# Patient Record
Sex: Female | Born: 1995 | Race: White | Hispanic: No | Marital: Single | Smoking: Never smoker
Health system: Southern US, Community
[De-identification: ages and names within clinical notes are randomized; demographics above are authoritative.]

---

## 2015-11-12 ENCOUNTER — Encounter: Payer: Self-pay | Admitting: *Deleted

## 2015-11-12 DIAGNOSIS — R1011 Right upper quadrant pain: Secondary | ICD-10-CM | POA: Insufficient documentation

## 2015-11-12 DIAGNOSIS — R109 Unspecified abdominal pain: Secondary | ICD-10-CM | POA: Diagnosis present

## 2015-11-12 LAB — COMPREHENSIVE METABOLIC PANEL
ALBUMIN: 4.5 g/dL (ref 3.5–5.0)
ALT: 22 U/L (ref 14–54)
AST: 21 U/L (ref 15–41)
Alkaline Phosphatase: 50 U/L (ref 38–126)
Anion gap: 5 (ref 5–15)
BUN: 15 mg/dL (ref 6–20)
CHLORIDE: 106 mmol/L (ref 101–111)
CO2: 26 mmol/L (ref 22–32)
CREATININE: 0.84 mg/dL (ref 0.44–1.00)
Calcium: 9.3 mg/dL (ref 8.9–10.3)
GFR calc Af Amer: >60 mL/min (ref >60)
GLUCOSE: 123 mg/dL — AB (ref 65–99)
POTASSIUM: 3.6 mmol/L (ref 3.5–5.1)
SODIUM: 137 mmol/L (ref 135–145)
Total Bilirubin: 0.4 mg/dL (ref 0.3–1.2)
Total Protein: 8.1 g/dL (ref 6.5–8.1)

## 2015-11-12 LAB — URINALYSIS COMPLETE WITH MICROSCOPIC (ARMC ONLY)
Bilirubin Urine: NEGATIVE
GLUCOSE, UA: NEGATIVE mg/dL
KETONES UR: NEGATIVE mg/dL
LEUKOCYTES UA: NEGATIVE
Nitrite: NEGATIVE
PROTEIN: NEGATIVE mg/dL
SPECIFIC GRAVITY, URINE: 1.023 (ref 1.005–1.030)
pH: 5 (ref 5.0–8.0)

## 2015-11-12 LAB — CBC
HEMATOCRIT: 40.7 % (ref 35.0–47.0)
Hemoglobin: 13.8 g/dL (ref 12.0–16.0)
MCH: 26.4 pg (ref 26.0–34.0)
MCHC: 33.8 g/dL (ref 32.0–36.0)
MCV: 78 fL — AB (ref 80.0–100.0)
PLATELETS: 320 10*3/uL (ref 150–440)
RBC: 5.23 MIL/uL — ABNORMAL HIGH (ref 3.80–5.20)
RDW: 13.5 % (ref 11.5–14.5)
WBC: 11 10*3/uL (ref 3.6–11.0)

## 2015-11-12 LAB — POCT PREGNANCY, URINE: Preg Test, Ur: NEGATIVE

## 2015-11-12 LAB — LIPASE, BLOOD: LIPASE: 44 U/L (ref 11–51)

## 2015-11-12 MED ORDER — ONDANSETRON HCL 4 MG/2ML IJ SOLN
4.0000 mg | Freq: Once | INTRAMUSCULAR | Status: AC | PRN
  Administered 2015-11-12: 4 mg via INTRAVENOUS
  Filled 2015-11-12: qty 2

## 2015-11-12 MED ORDER — SODIUM CHLORIDE 0.9 % IV SOLN
Freq: Once | INTRAVENOUS | Status: AC
  Administered 2015-11-12: via INTRAVENOUS

## 2015-11-12 NOTE — ED Notes (Signed)
Pt c/o progressively worsening RLQ abdominal pain, n/v/d. Pt c/o chills and rigors w/ abdominal pain. Pt states this has been recurrent over past two weeks, persistent throughout today and worsening at 2000 tonight. Pt is tachycardiac and flushed at this time.

## 2015-11-13 ENCOUNTER — Emergency Department: Payer: BLUE CROSS/BLUE SHIELD

## 2015-11-13 ENCOUNTER — Emergency Department
Admission: EM | Admit: 2015-11-13 | Discharge: 2015-11-13 | Disposition: A | Discharge status: Home / Self Care | Payer: BLUE CROSS/BLUE SHIELD | Attending: Emergency Medicine | Admitting: Emergency Medicine

## 2015-11-13 DIAGNOSIS — R109 Unspecified abdominal pain: Secondary | ICD-10-CM

## 2015-11-13 MED ORDER — SUCRALFATE 1 G PO TABS: 1.0000 g | ORAL_TABLET | Freq: Two times a day (BID) | ORAL | Status: AC

## 2015-11-13 MED ORDER — MORPHINE SULFATE (PF) 4 MG/ML IV SOLN
4.0000 mg | Freq: Once | INTRAVENOUS | Status: AC
  Administered 2015-11-13: 4 mg via INTRAVENOUS
  Filled 2015-11-13: qty 1

## 2015-11-13 MED ORDER — GI COCKTAIL ~~LOC~~
30.0000 mL | Freq: Once | ORAL | Status: AC
  Administered 2015-11-13: 30 mL via ORAL
  Filled 2015-11-13: qty 30

## 2015-11-13 MED ORDER — ONDANSETRON HCL 4 MG/2ML IJ SOLN
4.0000 mg | Freq: Once | INTRAMUSCULAR | Status: AC
  Administered 2015-11-13: 4 mg via INTRAVENOUS
  Filled 2015-11-13: qty 2

## 2015-11-13 MED ORDER — SODIUM CHLORIDE 0.9 % IV BOLUS (SEPSIS)
1000.0000 mL | Freq: Once | INTRAVENOUS | Status: AC
  Administered 2015-11-13: 1000 mL via INTRAVENOUS

## 2015-11-13 MED ORDER — IOPAMIDOL (ISOVUE-300) INJECTION 61%
125.0000 mL | Freq: Once | INTRAVENOUS | Status: AC | PRN
  Administered 2015-11-13: 125 mL via INTRAVENOUS

## 2015-11-13 MED ORDER — DIATRIZOATE MEGLUMINE & SODIUM 66-10 % PO SOLN
15.0000 mL | Freq: Once | ORAL | Status: AC
  Administered 2015-11-13: 15 mL via ORAL

## 2015-11-13 NOTE — ED Provider Notes (Signed)
[facility] Emergency Department Provider Note  ____________________________________________  Time seen: Approximately 103 AM  I have reviewed the triage vital signs and the nursing notes.   HISTORY  Chief Complaint Abdominal Pain    HPI Rhonda Edwards is a 20 y.o. female who comes into the hospital today with some right-sided abdominal pain. The patient reports that the pain started 4 days ago. She reports it is been really bad since about 8:30. She is not taking anything for pain. She did take some omeprazole earlier but it has not gotten any better. The patient reports that her pain is in her right upper quadrant. She has never had this before and she reports is worse after eating. She reports she is also had some vomiting and diarrhea after eating for the past 4 days. She denies any fevers and has had some hot and cold flashes with the shakes. The patient rates her pain 8 out of 10 in intensity currently. She feels some shortness of breath because it's worse when she takes a deep breath. The patient is also had some indigestion. She could not tolerate the pain anymore so she decided to come into the hospital for evaluation.   History reviewed. No pertinent past medical history.  There are no active problems to display for this patient.   History reviewed. No pertinent past surgical history.  Current Outpatient Rx  Name  Route  Sig  Dispense  Refill  . sucralfate (CARAFATE) 1 g tablet   Oral   Take 1 tablet (1 g total) by mouth 2 (two) times daily.   20 tablet   0     Allergies Review of patient's allergies indicates no known allergies.  History reviewed. No pertinent family history.  Social History Social History  Substance Use Topics  . Smoking status: Never Smoker   . Smokeless tobacco: Never Used  . Alcohol Use: No    Review of Systems Constitutional: No fever/chills Eyes: No visual changes. ENT: No sore  throat. Cardiovascular: Denies chest pain. Respiratory: Denies shortness of breath. Gastrointestinal: abdominal pain.  nausea, vomiting.  diarrhea.  No constipation. Genitourinary: Negative for dysuria. Musculoskeletal: Negative for back pain. Skin: Negative for rash. Neurological: Negative for headaches, focal weakness or numbness.  10-point ROS otherwise negative.  ____________________________________________   PHYSICAL EXAM:  VITAL SIGNS: ED Triage Vitals  Enc Vitals Group     BP 11/12/15 2306 137/85 mmHg     Pulse Rate 11/12/15 2306 123     Resp 11/12/15 2306 20     Temp 11/12/15 2306 99.3 F (37.4 C)     Temp Source 11/12/15 2306 Oral     SpO2 11/12/15 2306 97 %     Weight 11/12/15 2306 240 lb (108.863 kg)     Height 11/12/15 2306  (1.676 m)     Head Cir --      Peak Flow --      Pain Score 11/12/15 2307 8     Pain Loc --      Pain Edu? --      Excl. in GC? --     Constitutional: Alert and oriented. Well appearing and in Moderate distress. Eyes: Conjunctivae are normal. PERRL. EOMI. Head: Atraumatic. Nose: No congestion/rhinnorhea. Mouth/Throat: Mucous membranes are moist.  Oropharynx non-erythematous. Cardiovascular: Tachycardia regular rhythm. Grossly normal heart sounds.  Good peripheral circulation. Respiratory: Normal respiratory effort.  No retractions. Lungs CTAB. Gastrointestinal: Soft with right upper quadrant tenderness to palpation. No distention. Positive  bowel sounds Musculoskeletal: No lower extremity tenderness nor edema.   Neurologic:  Normal speech and language.  Skin:  Skin is warm, dry and intact.  Psychiatric: Mood and affect are normal.   ____________________________________________   LABS (all labs ordered are listed, but only abnormal results are displayed)  Labs Reviewed  COMPREHENSIVE METABOLIC PANEL - Abnormal; Notable for the following:    Glucose, Bld 123 (*)    All other components within normal limits  CBC - Abnormal;  Notable for the following:    RBC 5.23 (*)    MCV 78.0 (*)    All other components within normal limits  URINALYSIS COMPLETEWITH MICROSCOPIC ([facility] ONLY) - Abnormal; Notable for the following:    Color, Urine YELLOW (*)    APPearance CLEAR (*)    Hgb urine dipstick 1+ (*)    Bacteria, UA RARE (*)    Squamous Epithelial / LPF 0-5 (*)    All other components within normal limits  LIPASE, BLOOD  POC URINE PREG, ED  POCT PREGNANCY, URINE   ____________________________________________  EKG  None ____________________________________________  RADIOLOGY  Right upper quadrant ultrasound: Normal examination  CT abd and pelvis: No CT evidence for acute intraabdominal or pelvic process, normal appendix ____________________________________________   PROCEDURES  Procedure(s) performed: None  Critical Care performed: No  ____________________________________________   INITIAL IMPRESSION / ASSESSMENT AND PLAN / ED COURSE  Pertinent labs & imaging results that were available during my care of the patient were reviewed by me and considered in my medical decision making (see chart for details).  This is a 20 year old female who comes into the hospital today with some right upper quadrant pain that been going on for the past 4 days. The patient was tachycardic when she arrives I did give her a liter of normal saline, morphine and Zofran. The patient receive an ultrasound which was negative for gallstones. The patient's blood work is also unremarkable. I will give the patient a GI cocktail with a concern for gastritis and reassess her symptoms.  The patient did have a return of her shakes and tachycardia prior to the CT scan but the CT scan is unremarkable. At this point the patient's blood work and CT scan is unremarkable. I feel that the patient needs to follow-up with GI. The patient be discharged home to follow-up with the GI physicians. I explain this to the patient and her mother and  they agree with the plan as stated. ____________________________________________   FINAL CLINICAL IMPRESSION(S) / ED DIAGNOSES  Final diagnoses:  Abdominal pain      Rebecka ApleyAllison P Estes Lehner, MD 11/13/15 [phone]

## 2015-11-13 NOTE — Discharge Instructions (Signed)

## 2015-11-13 NOTE — ED Notes (Signed)
Pt discharged to home.  Family member driving.  Discharge instructions reviewed.  Verbalized understanding.  No questions or concerns at this time.  Teach back verified.  Pt in NAD.  No items left in ED.   

## 2017-06-25 IMAGING — US US ABDOMEN LIMITED
1 series · 14 of 25 positions shown · non-contrast
Comparison: None.

CLINICAL DATA: Right upper quadrant abdominal pain for 4 days.
Nausea, vomiting, diarrhea.

EXAM:
US ABDOMEN LIMITED - RIGHT UPPER QUADRANT

[Series 1: us abdomen limited · 0.23mm/px · 14 of 55 slices shown]
[im 1/55]
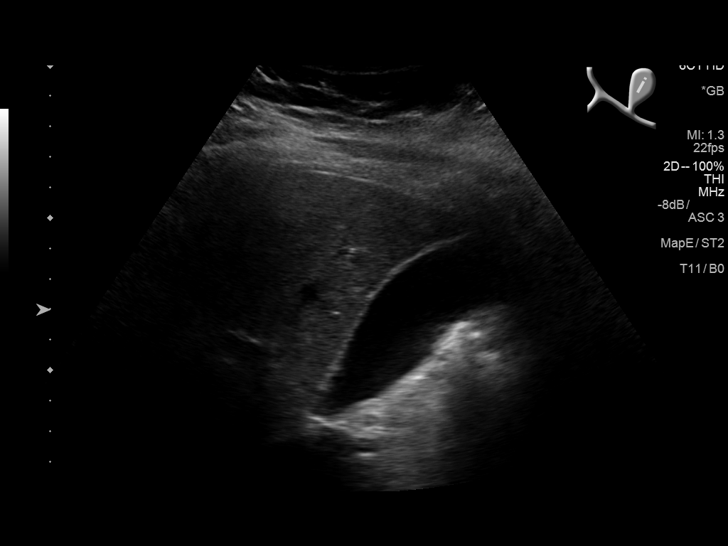
[im 5/55]
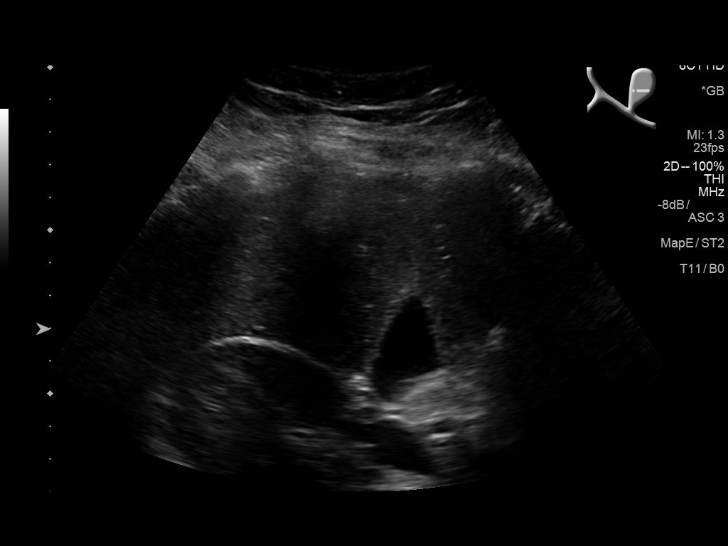
[im 10/55]
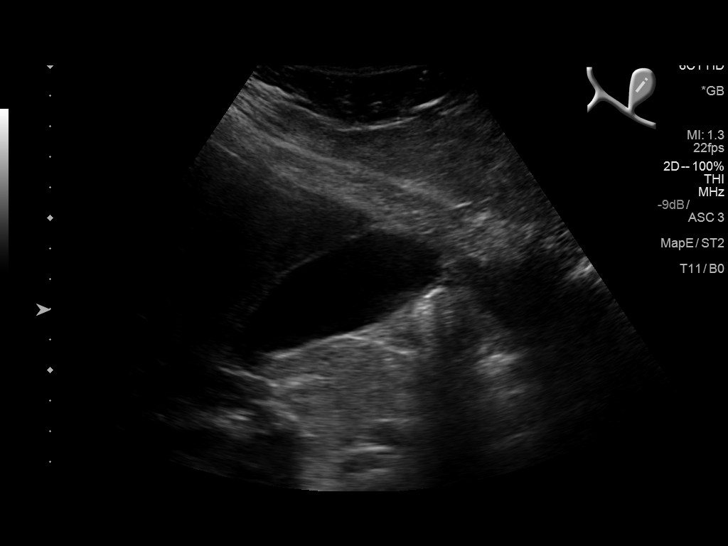
[im 14/55]
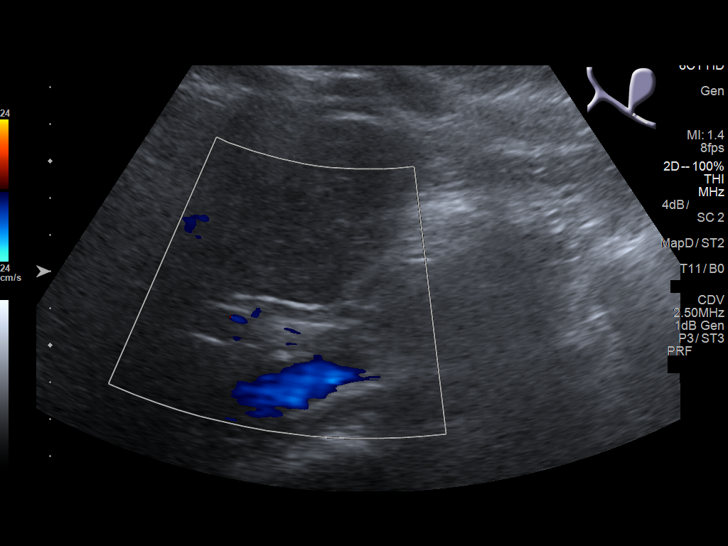
[im 19/55]
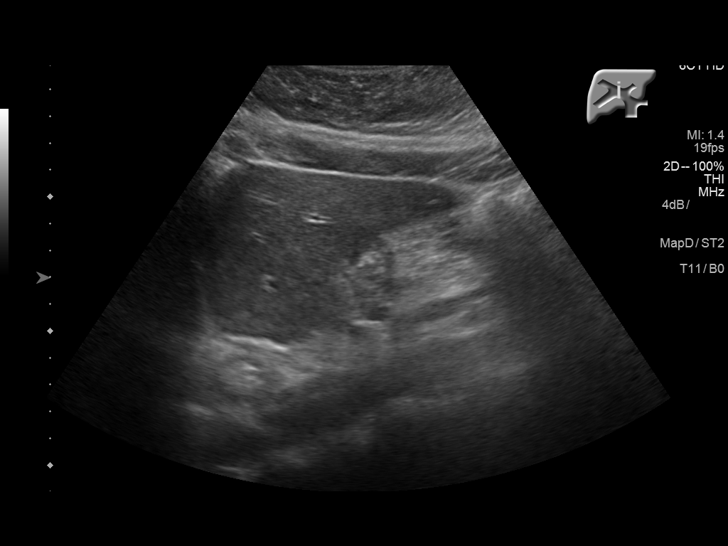
[im 21/55]
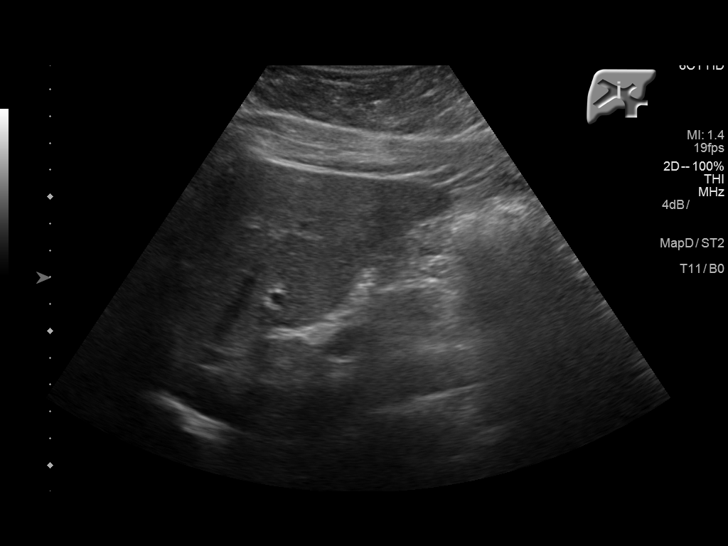
[im 25/55]
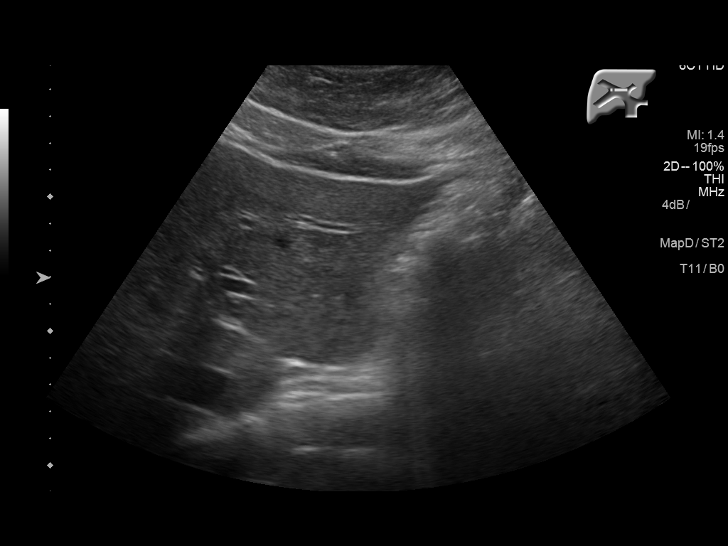
[im 30/55]
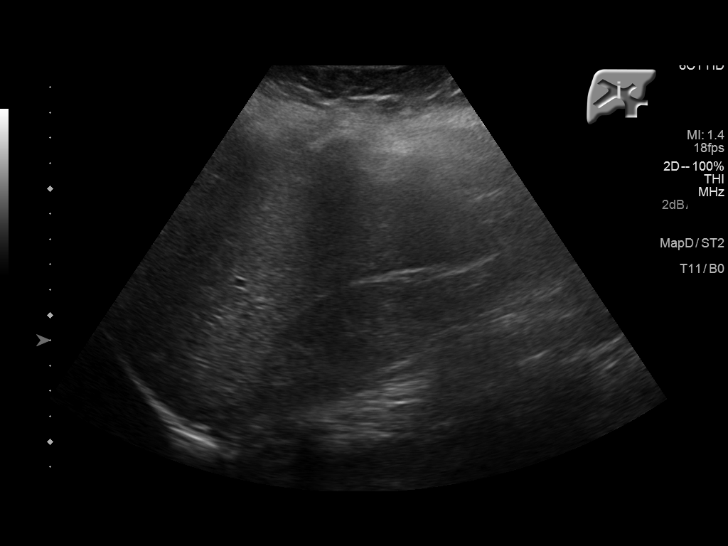
[im 34/55]
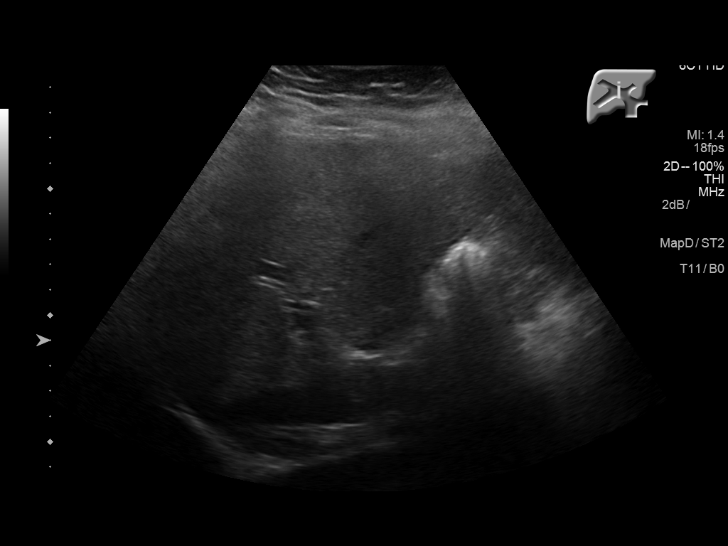
[im 37/55]
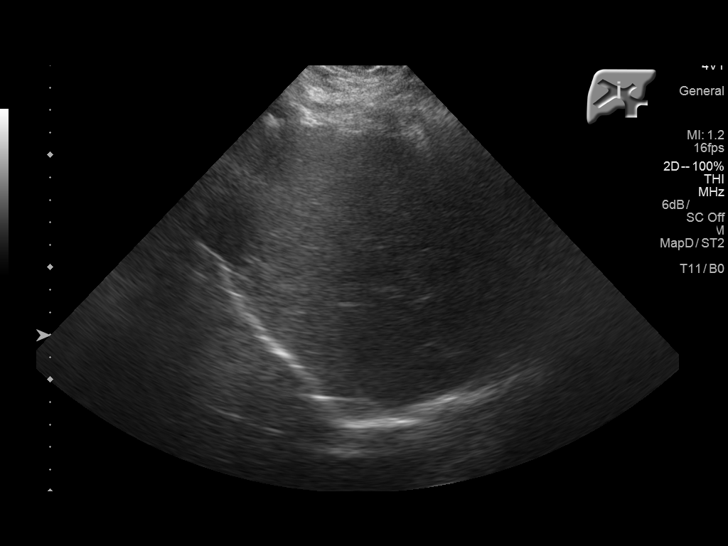
[im 41/55]
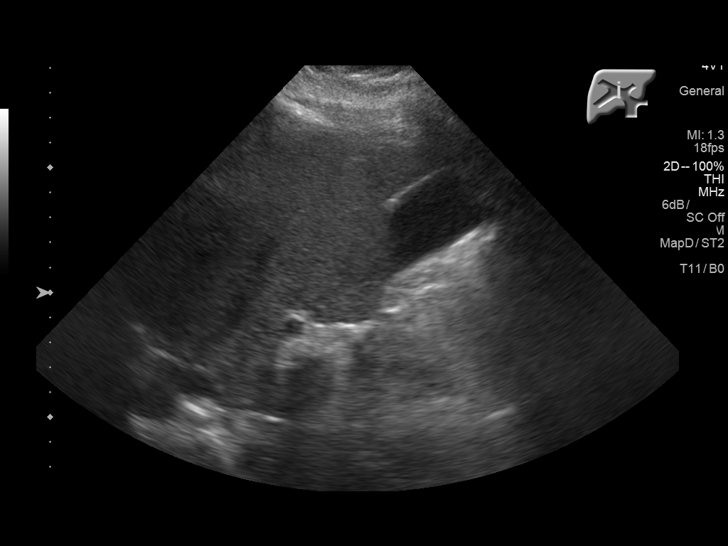
[im 46/55]
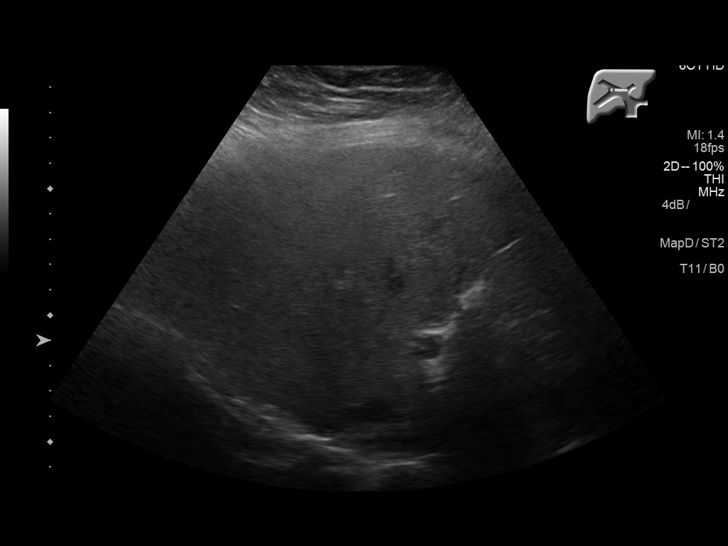
[im 50/55]
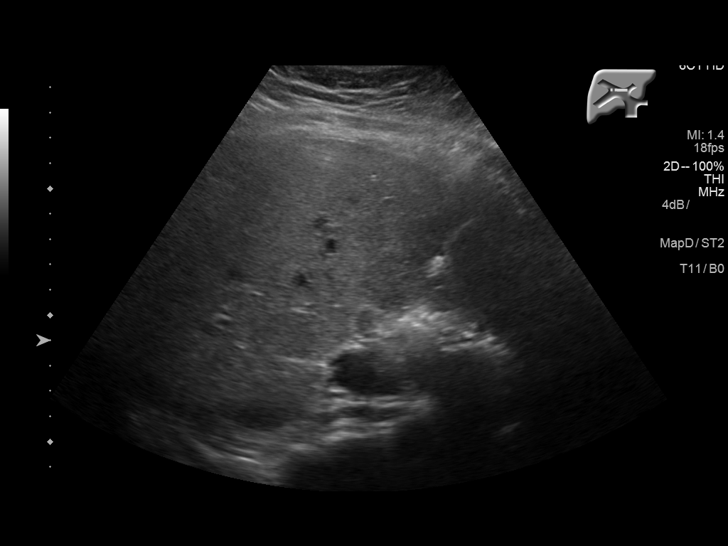
[im 55/55]
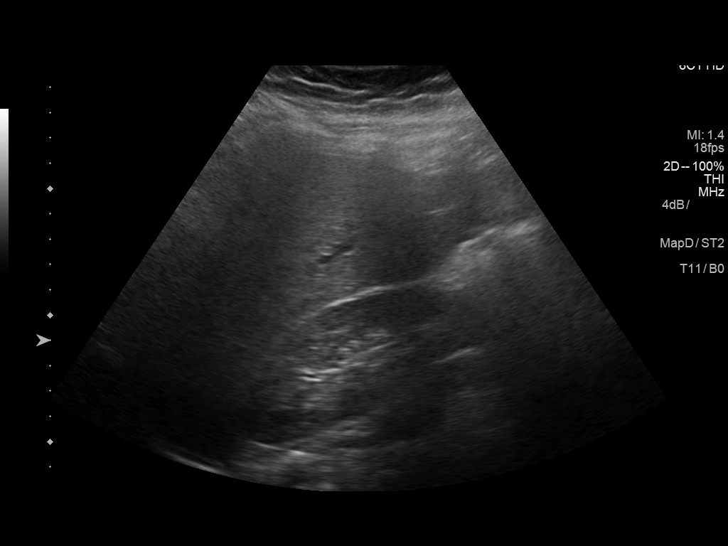

[14 of 25 positions shown; findings below may reference images not displayed]

FINDINGS: Gallbladder:

No gallstones or wall thickening visualized. No sonographic Murphy
sign noted by sonographer.

Common bile duct:

Diameter: 3.5 mm, normal

Liver:

No focal lesion identified. Within normal limits in parenchymal
echogenicity.
IMPRESSION: Normal examination.

## 2017-11-28 IMAGING — CT CT ABD-PELV W/ CM
2 of 4 series · 16 of 46 positions shown, 18 images · IV contrast (iopamidol)
Comparison: Prior ultrasound from earlier the same day.

CLINICAL DATA: Initial evaluation for acute right upper quadrant
pain, nausea, prominent in, diarrhea.

EXAM:
CT ABDOMEN AND PELVIS WITH CONTRAST
TECHNIQUE: Multidetector CT imaging of the abdomen and pelvis was performed
using the standard protocol following bolus administration of
intravenous contrast.
CONTRAST:  125mL [id number] IOPAMIDOL ([id number]) INJECTION 61%

[Series 2: routine abd pel with · axial · 0.89mm/px · z∈[-540,-85]mm · 13 of 101 slices shown, 15 images]
[im 5/101  soft-tissue]
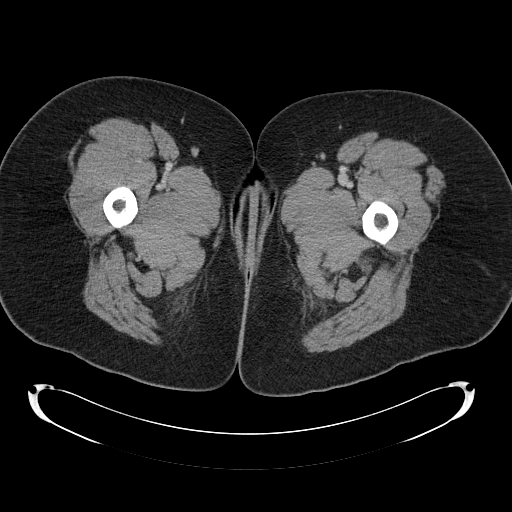
[im 5/101  bone]
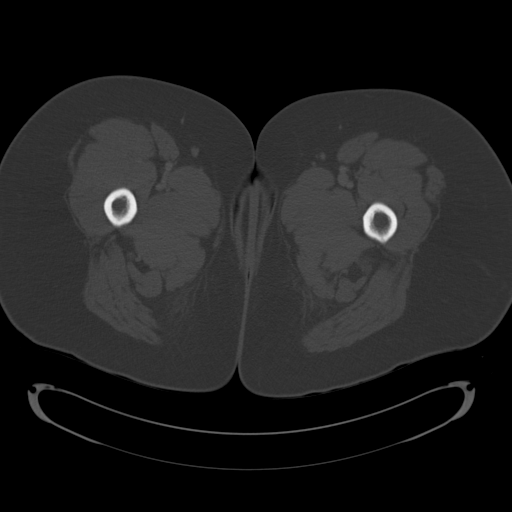
[im 13/101  soft-tissue]
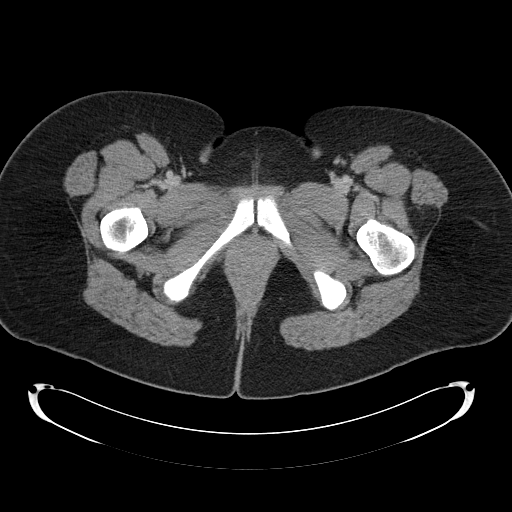
[im 21/101  soft-tissue]
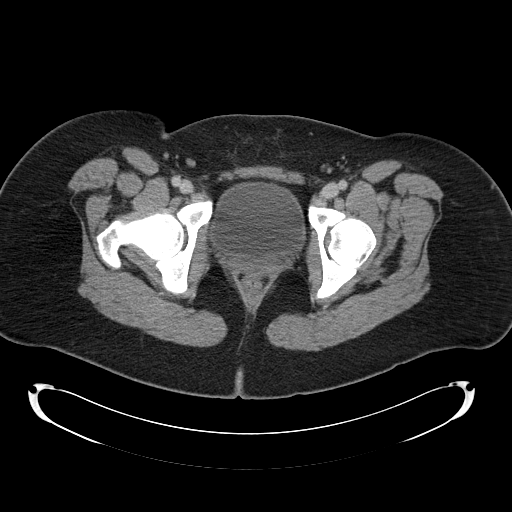
[im 30/101  soft-tissue]
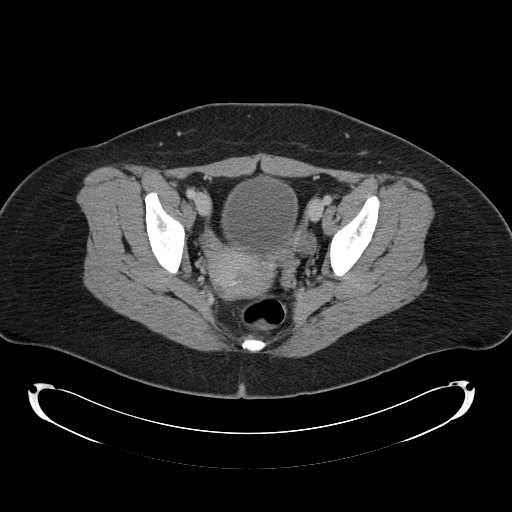
[im 34/101  soft-tissue]
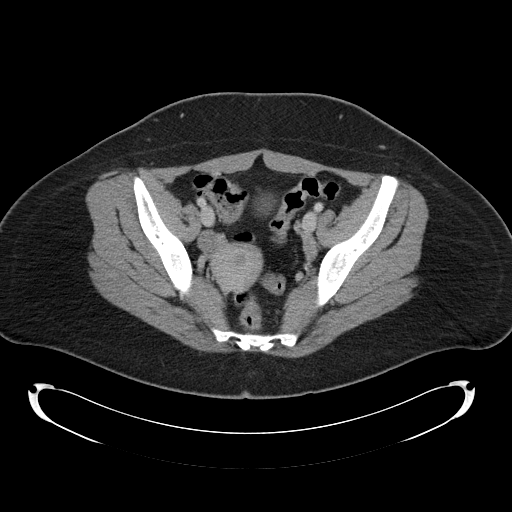
[im 42/101  soft-tissue]
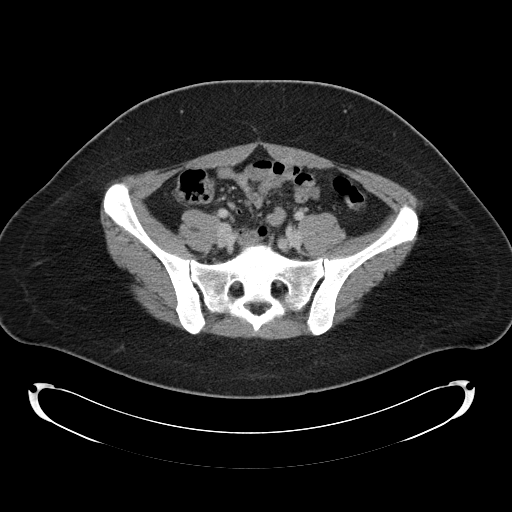
[im 51/101  soft-tissue]
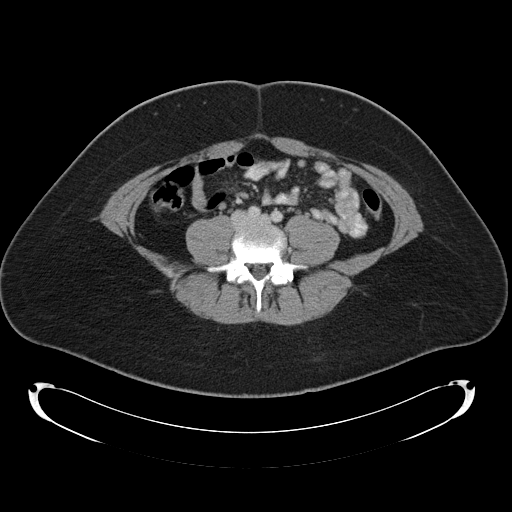
[im 59/101  soft-tissue]
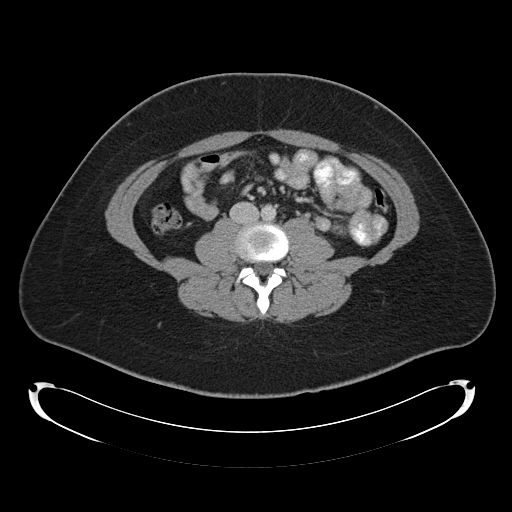
[im 67/101  soft-tissue]
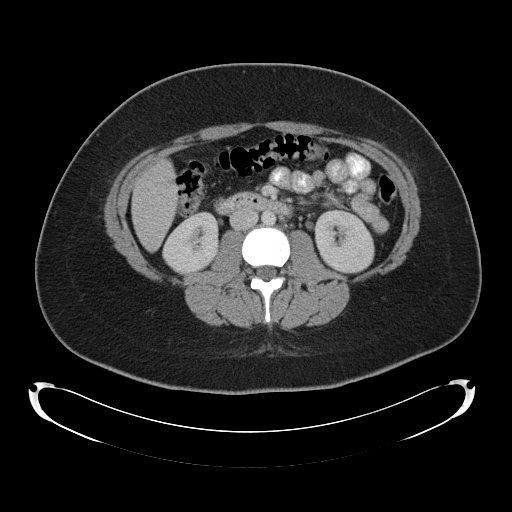
[im 67/101  bone]
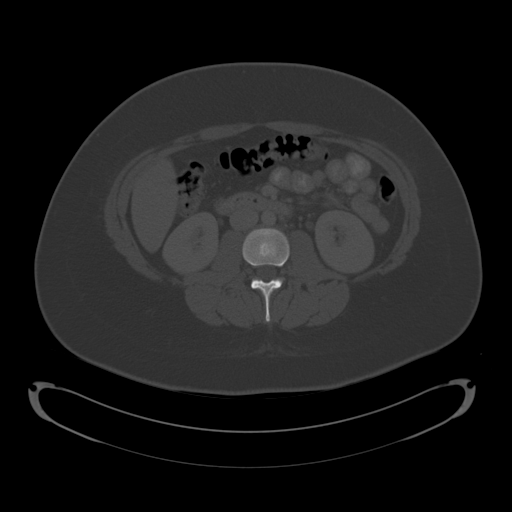
[im 71/101  soft-tissue]
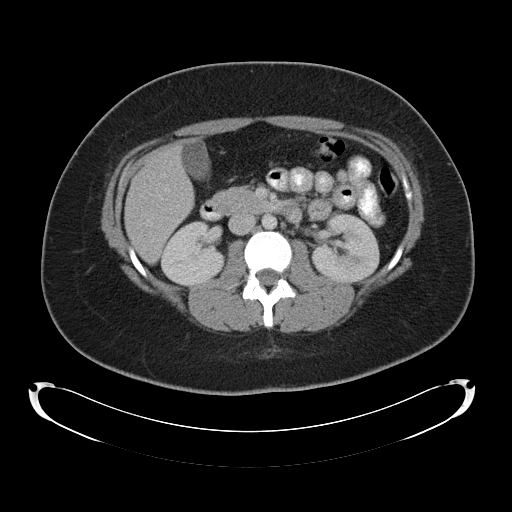
[im 80/101  soft-tissue]
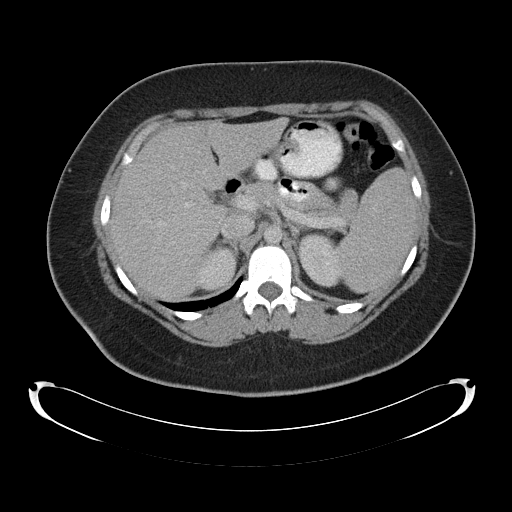
[im 88/101  soft-tissue]
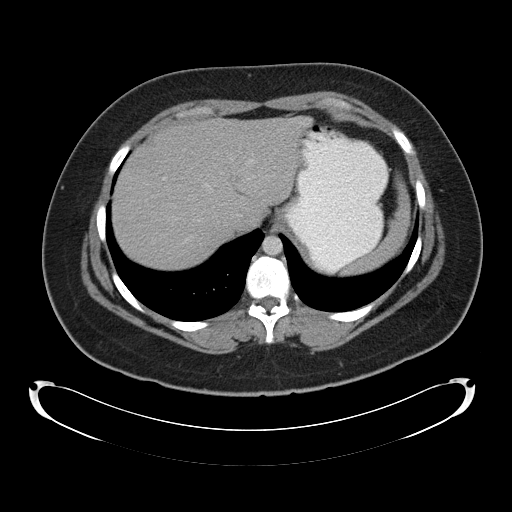
[im 96/101  soft-tissue]
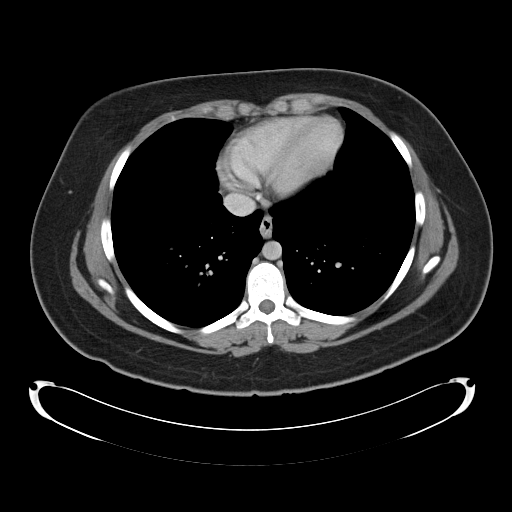

[Series 5: cor routine abd pel with · coronal · 0.86mm/px · 3 of 138 slices shown]
[im 46/138  soft-tissue]
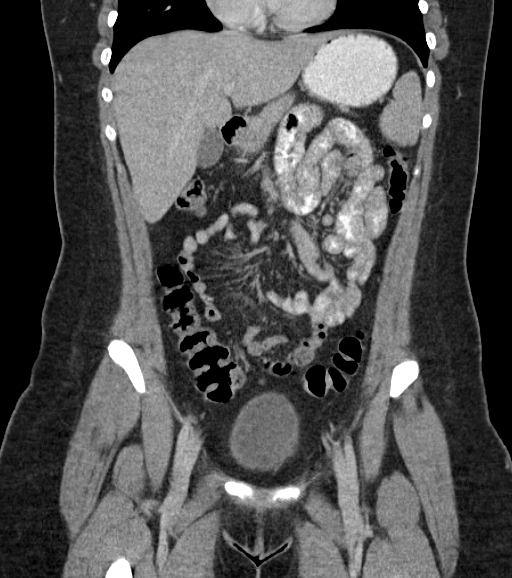
[im 61/138  soft-tissue]
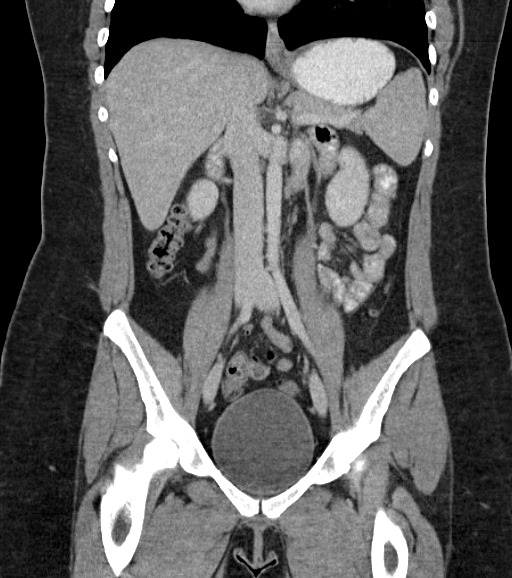
[im 77/138  soft-tissue]
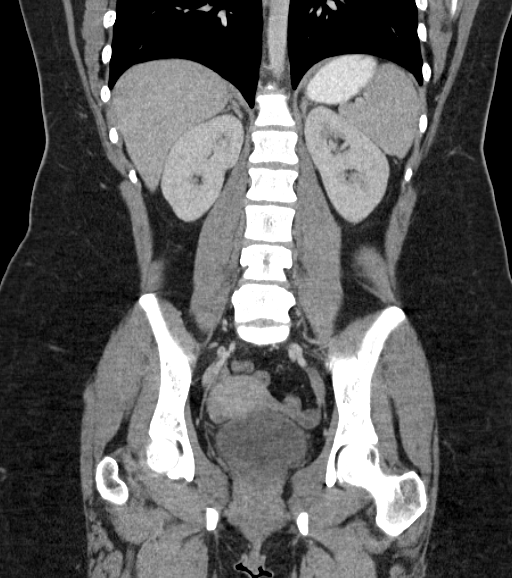

[16 of 46 positions shown; findings below may reference images not displayed]

FINDINGS: Visualized lung bases are clear.

Liver demonstrates a normal contrast enhanced appearance.
Gallbladder within normal limits. No biliary dilatation. Spleen,
adrenal glands, and pancreas demonstrate a normal contrast enhanced
appearance.

Kidneys are equal in size with symmetric enhancement. No
nephrolithiasis, hydronephrosis, or focal enhancing renal mass.

Stomach within normal limits. No evidence for bowel obstruction. No
abnormal wall thickening, mucosal enhancement, or inflammatory fat
stranding seen about the bowels. Appendix well visualized in the
right lower quadrant and is of normal caliber and appearance
associated inflammatory changes to suggest acute appendicitis.

Bladder within normal limits. Uterus and ovaries normal for patient
age.

No free air or fluid. No pathologically enlarged intra-abdominal
pelvic lymph nodes. Normal intravascular enhancement seen throughout
the intra-abdominal aorta and its branch vessels.

No acute osseous abnormality. No worrisome lytic or blastic osseous
lesions.
IMPRESSION: 1. No CT evidence for acute intra-abdominal or pelvic process.
2. Normal appendix.

## 2023-02-17 ENCOUNTER — Other Ambulatory Visit: Payer: Self-pay | Admitting: Obstetrics and Gynecology

## 2023-02-17 DIAGNOSIS — E288 Other ovarian dysfunction: Secondary | ICD-10-CM

## 2023-02-19 ENCOUNTER — Ambulatory Visit: Payer: BC Managed Care – PPO

## 2023-02-20 ENCOUNTER — Other Ambulatory Visit: Payer: Self-pay | Admitting: Obstetrics and Gynecology

## 2023-02-20 DIAGNOSIS — E288 Other ovarian dysfunction: Secondary | ICD-10-CM

## 2023-02-20 DIAGNOSIS — L680 Hirsutism: Secondary | ICD-10-CM

## 2023-02-28 ENCOUNTER — Ambulatory Visit
Admission: RE | Admit: 2023-02-28 | Discharge: 2023-02-28 | Disposition: A | Discharge status: Home / Self Care | Payer: BC Managed Care – PPO | Source: Ambulatory Visit | Attending: Obstetrics and Gynecology | Admitting: Obstetrics and Gynecology

## 2023-02-28 DIAGNOSIS — L680 Hirsutism: Secondary | ICD-10-CM

## 2023-02-28 DIAGNOSIS — E288 Other ovarian dysfunction: Secondary | ICD-10-CM

## 2023-03-07 ENCOUNTER — Encounter: Payer: BC Managed Care – PPO | Attending: Obstetrics and Gynecology | Admitting: Dietician

## 2023-03-07 ENCOUNTER — Encounter: Payer: Self-pay | Admitting: Dietician

## 2023-03-07 VITALS — Ht 67.0 in | Wt 252.2 lb

## 2023-03-07 DIAGNOSIS — R635 Abnormal weight gain: Secondary | ICD-10-CM | POA: Diagnosis not present

## 2023-03-07 NOTE — Patient Instructions (Addendum)
Consider starting a daily prenatal multivitamin.  Keep up the great work on being physically active!  Start your day with a protein bar or drink that is at least 10 g of protein.  Try a few different brands of yogurts:  [place] - Oikos Triple Zero, Danon Light-n-Fit, Chobani,  Skyr - [employer] these with your protein granola or a fruit of your liking.  Work towards eating three meals a day, about 5-6 hours apart!  Begin to recognize carbohydrates, proteins, and non-starchy vegetables in your food choices!  Begin to build your meals using the proportions of the Balanced Plate. First, select your carb choice(s) for the meal. Make this 25% of your meal. Next, select your source of protein to pair with your carb choice(s). Make this another 25% of your meal. Choose lean/low fat proteins as often as possible. Finally, complete your meal with a variety of non-starchy vegetables. Make this the remaining 50% of your meal.

## 2023-03-07 NOTE — Progress Notes (Signed)
Medical Nutrition Therapy  Appointment Start time:  [phone]  Appointment End time:  0930  Primary concerns today: PCOS management/Insulin resistance  Referral diagnosis: R63.5 - Weight Gain Preferred learning style: No preference indicated Learning readiness: Ready   NUTRITION ASSESSMENT   Anthropometrics  Ht: 5'7" Wt: 252.2 lbs BMI: 39.50 kg/m2  Clinical Medical Hx: PCOS, Gastritis,  Medications: Pantoprazole, Spironolactone,  Labs: A1c - 5.7%, Insulin - 21.9 (elevated, in range) Ferritin - 5 (low), Notable Signs/Symptoms: Chronic fatigue  Lifestyle & Dietary Hx Pt reports being diagnosed with PCOS and large fibroid on uterus in the past week. Pt reports history of unintended weight gain, relieved they got PCOS diagnosis as an answer to weight gain. Pt reports skipping breakfast most days (just not hungry), may miss lunch depending on work schedule and typically eats lunch on the go. Pt reports occasional periods of high stress at work, states they are less hungry at dinner on high stress days. Pt reports being active regularly, and also exercises when stressed (hiking, line dancing, weight training, step, rising horse, walking dog), states they like to hike different area of [place].    Estimated daily fluid intake: <64 oz Supplements: B12, Probiotic Sleep: Standard Stress / self-care: 8/10 on bad weeks (1 week/month), 3/10 on good weeks (3 weeks/month) Current average weekly physical activity: ADL   24-Hr Dietary Recall First Meal: No breakfast Snack: none Second Meal: [place] yogurt w/ fruit and granola Snack: none Third Meal: Chicken, broccoli, rice casserole Snack: none Beverages: Water, Coffee, Ollipop   NUTRITION DIAGNOSIS  [place]-2.1 Inpaired nutrition utilization As related to PCOS.  As evidenced by A1c of 5.7%, serum insulin of 21.9.   NUTRITION INTERVENTION  Nutrition education (E-1) on the following topics:  Educated patient on the pathophysiology of prediabetes.  This includes why our bodies need circulating blood sugar, the relationship between insulin and blood sugar, and the results of insulin resistance and/or pancreatic insufficiency on the progression of prediabetes. Educated patient on factors that contribute to elevation of blood sugars, such as stress, illness, injury,and food choices. Discussed the role that physical activity plays in lowering blood sugar. Educate patient on the three main macronutrients. Protein, fats, and carbohydrates. Discussed how each of these macronutrients affect blood sugar levels, especially carbohydrate, and the importance of eating a consistent amount of carbohydrate throughout the day.   Handouts Provided Include  Balanced Plate Food List Potassium Containing foods  Learning Style & Readiness for Change Teaching method utilized: Visual & Auditory  Demonstrated degree of understanding via: Teach Back  Barriers to learning/adherence to lifestyle change: None  Goals Established by Pt Consider starting a daily prenatal multivitamin. Keep up the great work on being physically active! Start your day with a protein bar or drink that is at least 10 g of protein. Try a few different brands of yogurts:  [place] - Oikos Triple Zero, Danon Light-n-Fit, Chobani,  Skyr - [employer] these with your protein granola or a fruit of your liking. Work towards eating three meals a day, about 5-6 hours apart! Begin to recognize carbohydrates, proteins, and non-starchy vegetables in your food choices! Begin to build your meals using the proportions of the Balanced Plate. First, select your carb choice(s) for the meal. Make this 25% of your meal. Next, select your source of protein to pair with your carb choice(s). Make this another 25% of your meal. Choose lean/low fat proteins as often as possible. Finally, complete your meal with a variety of non-starchy vegetables. Make  this the remaining 50% of your  meal.   MONITORING & EVALUATION Dietary intake, weekly physical activity, fatigue level, and weight change in 6 months.  Next Steps  Patient is to follow up with RD, call for follow-up sooner if needed.

## 2023-09-15 ENCOUNTER — Ambulatory Visit: Payer: BC Managed Care – PPO | Admitting: Dietician

## 2023-09-29 ENCOUNTER — Ambulatory Visit: Payer: Self-pay | Admitting: Dietician

## 2023-11-03 ENCOUNTER — Encounter: Payer: Self-pay | Admitting: Dietician

## 2023-11-03 DIAGNOSIS — R635 Abnormal weight gain: Secondary | ICD-10-CM | POA: Diagnosis present

## 2023-11-03 DIAGNOSIS — Z713 Dietary counseling and surveillance: Secondary | ICD-10-CM | POA: Insufficient documentation

## 2023-11-03 NOTE — Patient Instructions (Signed)
 Keep up the great work on being physically active! Work towards eating three meals a day, about 5-6 hours apart! Begin to recognize carbohydrates, proteins, and non-starchy vegetables in your food choices! Begin to build your meals using the proportions of the Balanced Plate.

## 2023-11-03 NOTE — Progress Notes (Signed)
 Medical Nutrition Therapy  Appointment Start time:  [phone]  Appointment End time:  1530  Primary concerns today:  PCOS management/Insulin resistance   Referral diagnosis: R63.5 - Weight Gain Preferred learning style:   no preference indicated Learning readiness:  ready-change in progress   NUTRITION ASSESSMENT  Clinical Medical Hx: PCOS, Gastritis,  Medications: Pantoprazole, Spironolactone Labs: A1c - 5.6% obtained 11/03/2023 per Pt  Notable Signs/Symptoms: fatigue, cravings  Lifestyle & Dietary Hx Pt presents today alone. Pt provide new labs stating an improved A1C%. Pt reports desired weight reductions have plateaued.  Pt reports skipping breakfast 3-4 days weekly and states consistency with lunch and dinner intake. Pt states plan to begin GLP, currently rx waiting on approval through insurance. T reports challenges of cravings, Eating out when traveling and preventing fatigues. Pt reports she aims to walks at work, walk the dog each evening averaging 8-10 K steps daily. All Pt's questions were answered during this encounter.   Estimated daily fluid intake: 64 oz+ Supplements: B12, Probiotic, prenatal MVI Sleep: Standard Stress / self-care: 8/10 on bad weeks self care includes physical activity and writing Current average weekly physical activity: 30 minutes daily; average steps daily 8-10 K    24-Hr Dietary Recall First Meal: barebell's protein bar or over night oats, coffee with heavy whipped cream  Snack: none Second Meal: [place] sausage, cheese, almonds, 10 grapes in a bento box water Snack: none Third Meal:  chicken or beef, green beans or 1 cup potatoes, water Snack: none Beverages: Water, Ollipop, coffee with heavy whipped cream, 12 ounces coke 1-2 weekly   NUTRITION DIAGNOSIS  [place]-2.1 Inpaired nutrition utilization As related to PCOS.  As evidenced by A1c of 5.7%, serum insulin of 21.9.  NUTRITION INTERVENTION  Nutrition education (E-1) on the following topics:   Fruits & Vegetables: Aim to fill half your plate with a variety of fruits and vegetables. They are rich in vitamins, minerals, and fiber, and can help reduce the risk of chronic diseases. Choose a colorful assortment of fruits and vegetables to ensure you get a wide range of nutrients. Grains and Starches: Make at least half of your grain choices whole grains, such as brown rice, whole wheat bread, and oats. Whole grains provide fiber, which aids in digestion and healthy cholesterol levels. Aim for whole forms of starchy vegetables such as potatoes, sweet potatoes, beans, peas, and corn, which are fiber rich and provide many vitamins and minerals.  Protein: Incorporate lean sources of protein, such as poultry, fish, beans, nuts, and seeds, into your meals. Protein is essential for building and repairing tissues, staying full, balancing blood sugar, as well as supporting immune function. Dairy: Include low-fat or fat-free dairy products like milk, yogurt, and cheese in your diet. Dairy foods are excellent sources of calcium and vitamin D, which are crucial for bone health.  Physical Activity: Aim for 60 minutes of physical activity daily. Regular physical activity promotes overall health-including helping to reduce risk for heart disease and diabetes, promoting mental health, and helping [place] sleep better. Plus 2 days of weight resistance training.    Handouts Provided Include  Carb controled snack ideas Plate planner (sanofi)  Learning Style & Readiness for Change Teaching method utilized: Visual & Auditory  Demonstrated degree of understanding via: Teach Back  Barriers to learning/adherence to lifestyle change: time management  Goals Established by Pt Keep up the great work on being physically active! Work towards eating three meals a day, about 5-6 hours apart! Begin to recognize  carbohydrates, proteins, and non-starchy vegetables in your food choices! Begin to build your meals using the  proportions of the Balanced Plate.   MONITORING & EVALUATION Dietary intake, weekly physical activity  Next Steps  Patient is to return PRN.
